# Patient Record
Sex: Male | Born: 1955 | Race: White | Hispanic: No | Marital: Married | Smoking: Never smoker
Health system: Southern US, Community
[De-identification: ages and names within clinical notes are randomized; demographics above are authoritative.]

## PROBLEM LIST (undated history)

## (undated) DIAGNOSIS — C801 Malignant (primary) neoplasm, unspecified: Secondary | ICD-10-CM

## (undated) DIAGNOSIS — D0372 Melanoma in situ of left lower limb, including hip: Secondary | ICD-10-CM

## (undated) DIAGNOSIS — N486 Induration penis plastica: Secondary | ICD-10-CM

## (undated) DIAGNOSIS — Z872 Personal history of diseases of the skin and subcutaneous tissue: Secondary | ICD-10-CM

## (undated) DIAGNOSIS — Z9889 Other specified postprocedural states: Secondary | ICD-10-CM

## (undated) HISTORY — DX: Malignant (primary) neoplasm, unspecified: C80.1

## (undated) HISTORY — DX: Personal history of diseases of the skin and subcutaneous tissue: Z87.2

## (undated) HISTORY — PX: PROSTATE BIOPSY: SHX241

## (undated) HISTORY — DX: Melanoma in situ of left lower limb, including hip: D03.72

## (undated) HISTORY — DX: Other specified postprocedural states: Z98.890

## (undated) HISTORY — DX: Induration penis plastica: N48.6

## (undated) HISTORY — PX: TONSILLECTOMY AND ADENOIDECTOMY: SUR1326

---

## 1988-06-27 DIAGNOSIS — Z872 Personal history of diseases of the skin and subcutaneous tissue: Secondary | ICD-10-CM

## 1988-06-27 HISTORY — DX: Personal history of diseases of the skin and subcutaneous tissue: Z87.2

## 1993-06-27 DIAGNOSIS — D0372 Melanoma in situ of left lower limb, including hip: Secondary | ICD-10-CM

## 1993-06-27 HISTORY — DX: Melanoma in situ of left lower limb, including hip: D03.72

## 2006-06-27 HISTORY — PX: OTHER SURGICAL HISTORY: SHX169

## 2007-10-06 ENCOUNTER — Observation Stay: Payer: Self-pay | Admitting: Internal Medicine

## 2007-10-06 ENCOUNTER — Other Ambulatory Visit: Payer: Self-pay

## 2009-01-14 IMAGING — CR DG CHEST 1V PORT
1 series · 1 of 1 positions shown · non-contrast
Comparison: none

REASON FOR EXAM: Chest Pain
COMMENTS:

PROCEDURE:     DXR - DXR PORTABLE CHEST SINGLE VIEW  - October 06, 2007  [DATE]
RESULT:     Comparison: No available comparison exam.

[view not recorded]
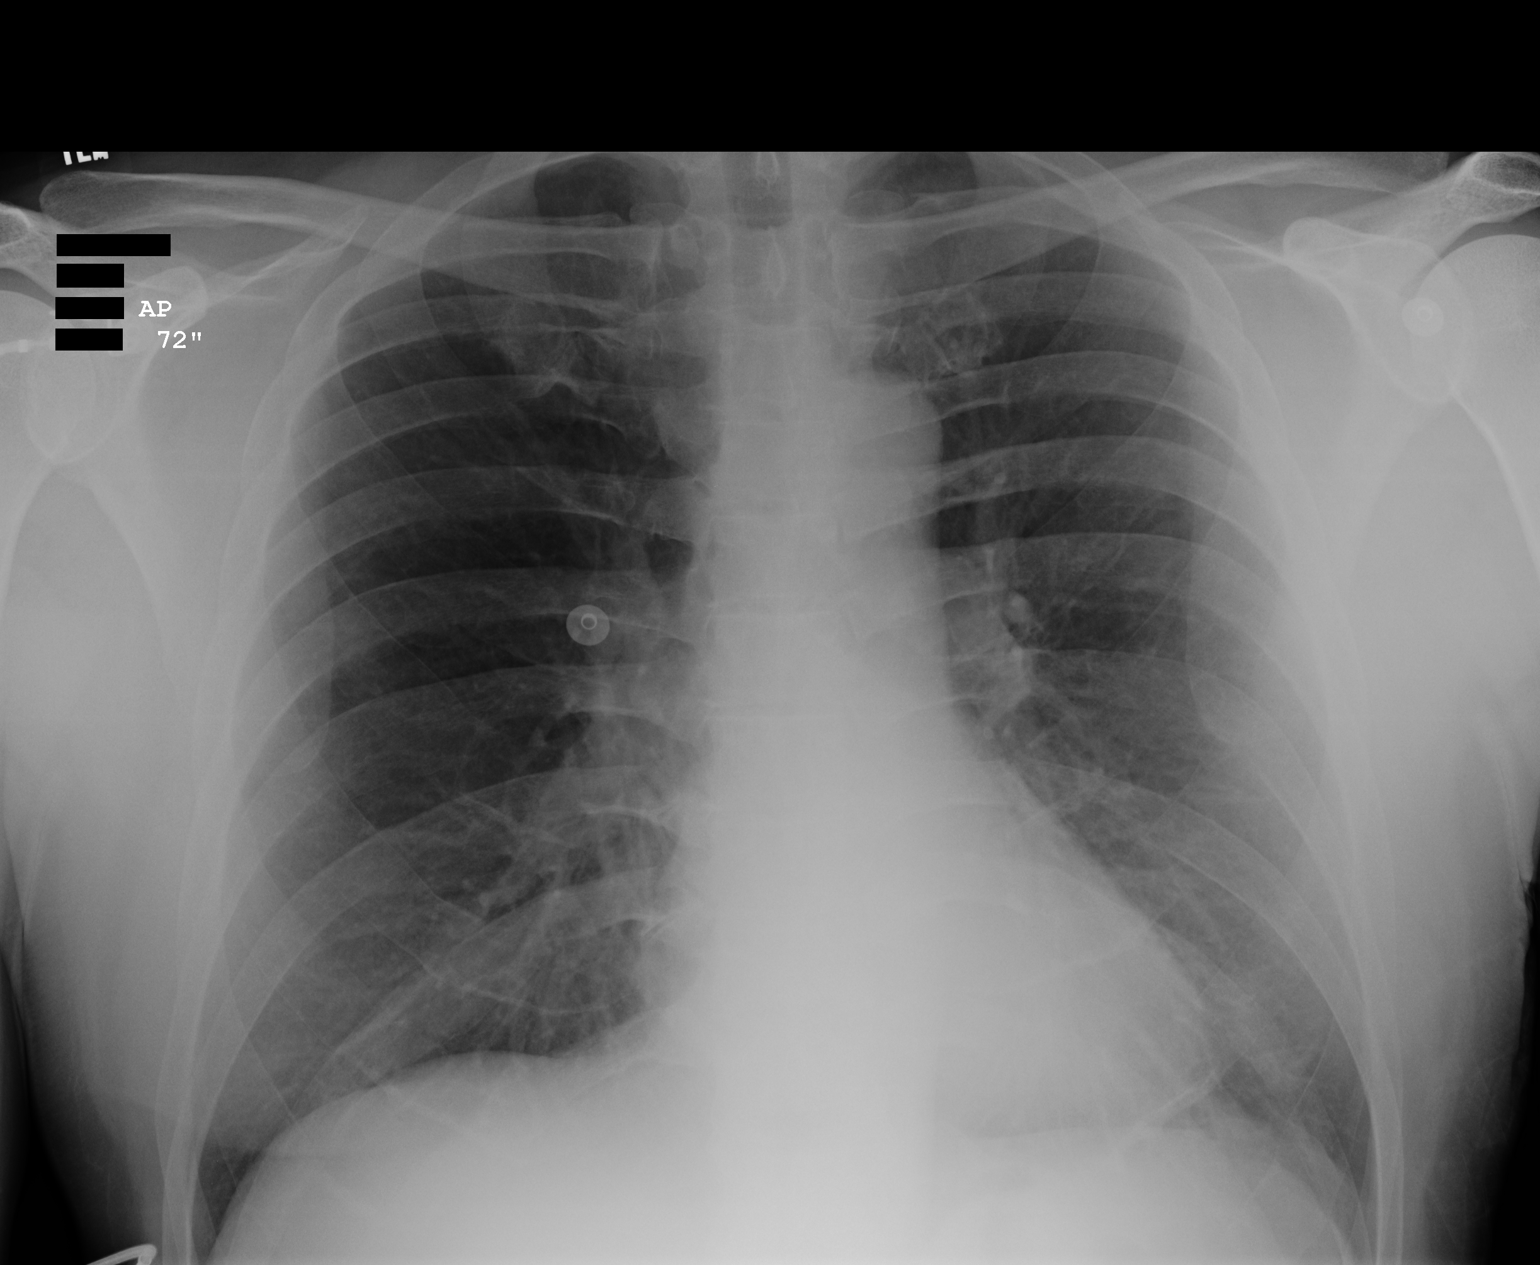

[1 of 1 positions shown; findings below may reference images not displayed]

FINDINGS: There is mild left basilar atelectasis. There is otherwise no significant
pulmonary consolidation, pulmonary edema, pleural effusion, nor
pneumothorax. The cardiomediastinal silhouette is within normal limits.

No grossly displaced rib fracture is noted.
IMPRESSION: 1. No acute cardiopulmonary abnormality is noted.

## 2009-06-29 ENCOUNTER — Ambulatory Visit: Payer: Self-pay | Admitting: Internal Medicine

## 2009-09-14 ENCOUNTER — Ambulatory Visit: Payer: Self-pay | Admitting: Gastroenterology

## 2010-10-08 IMAGING — CR DG CHEST 2V
1 series · 2 of 2 positions shown · non-contrast
Comparison: none

REASON FOR EXAM: bronchitis
COMMENTS:

[Series 1: view not recorded · 0.17mm/px · 2 of 2 slices shown]
[im 1/2]
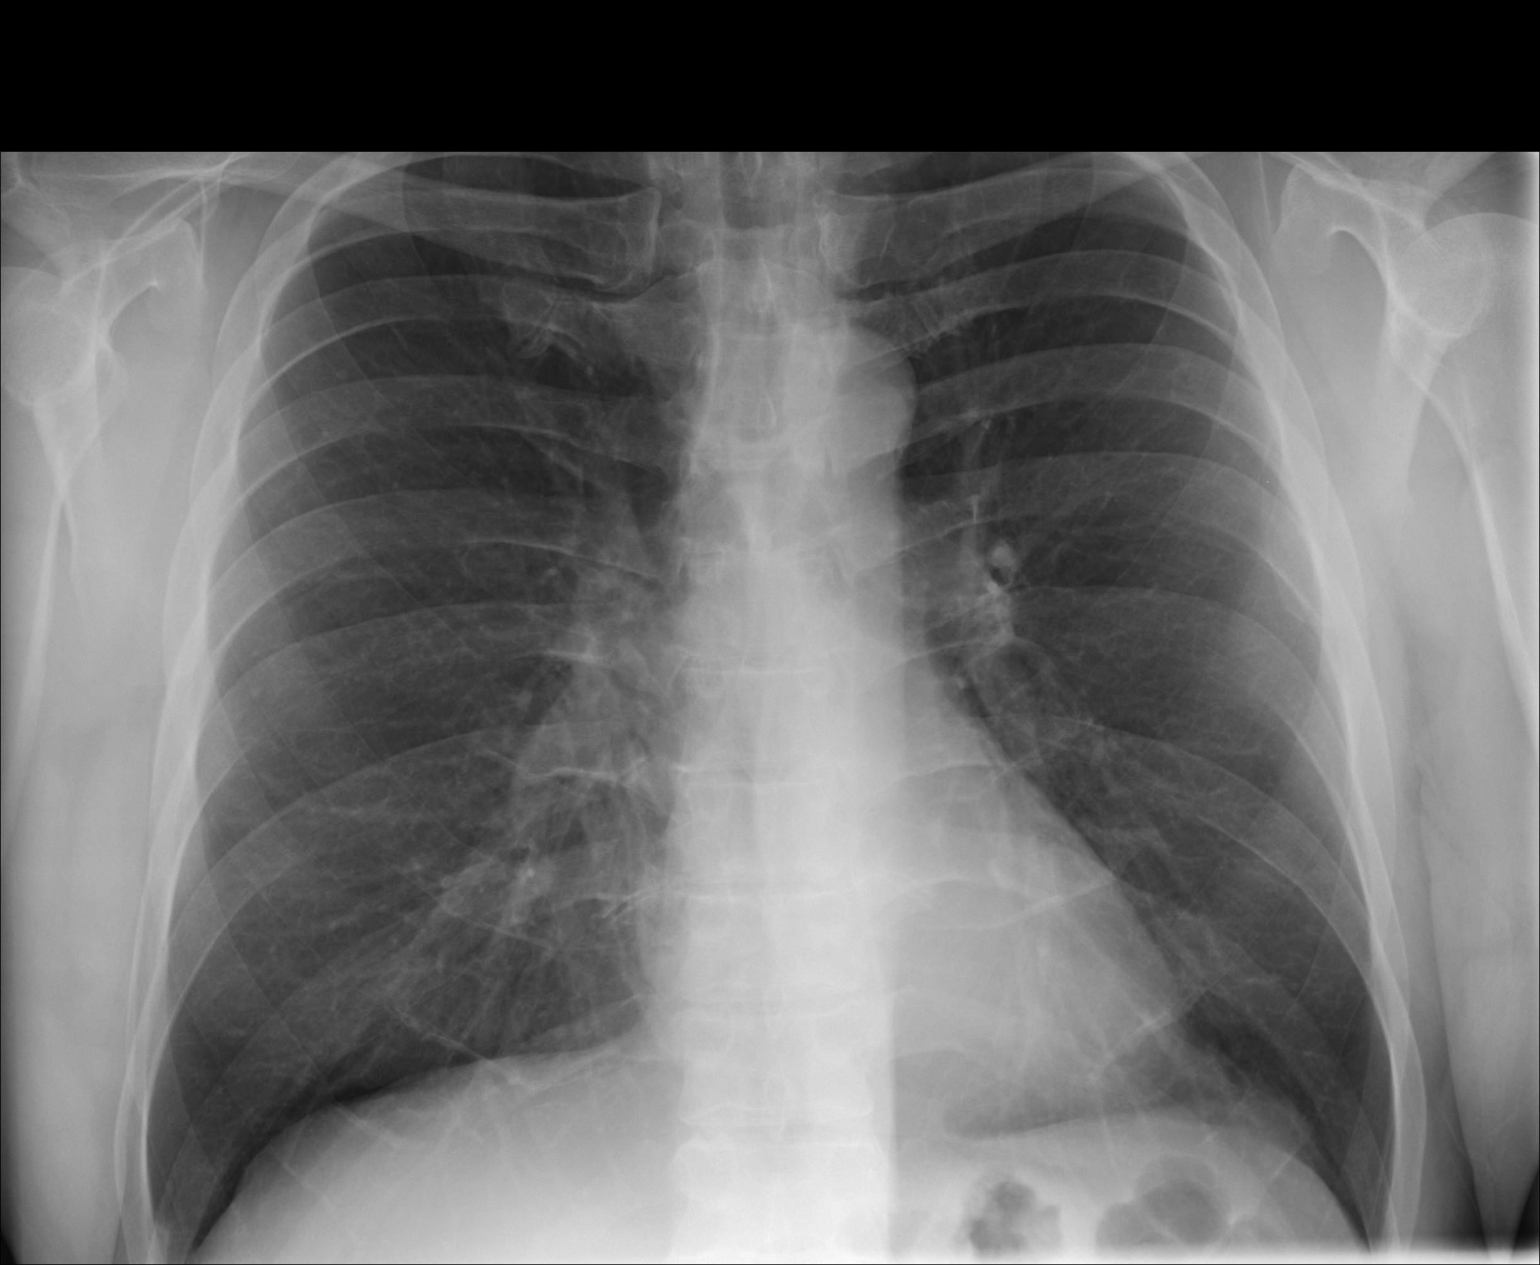
[im 2/2]
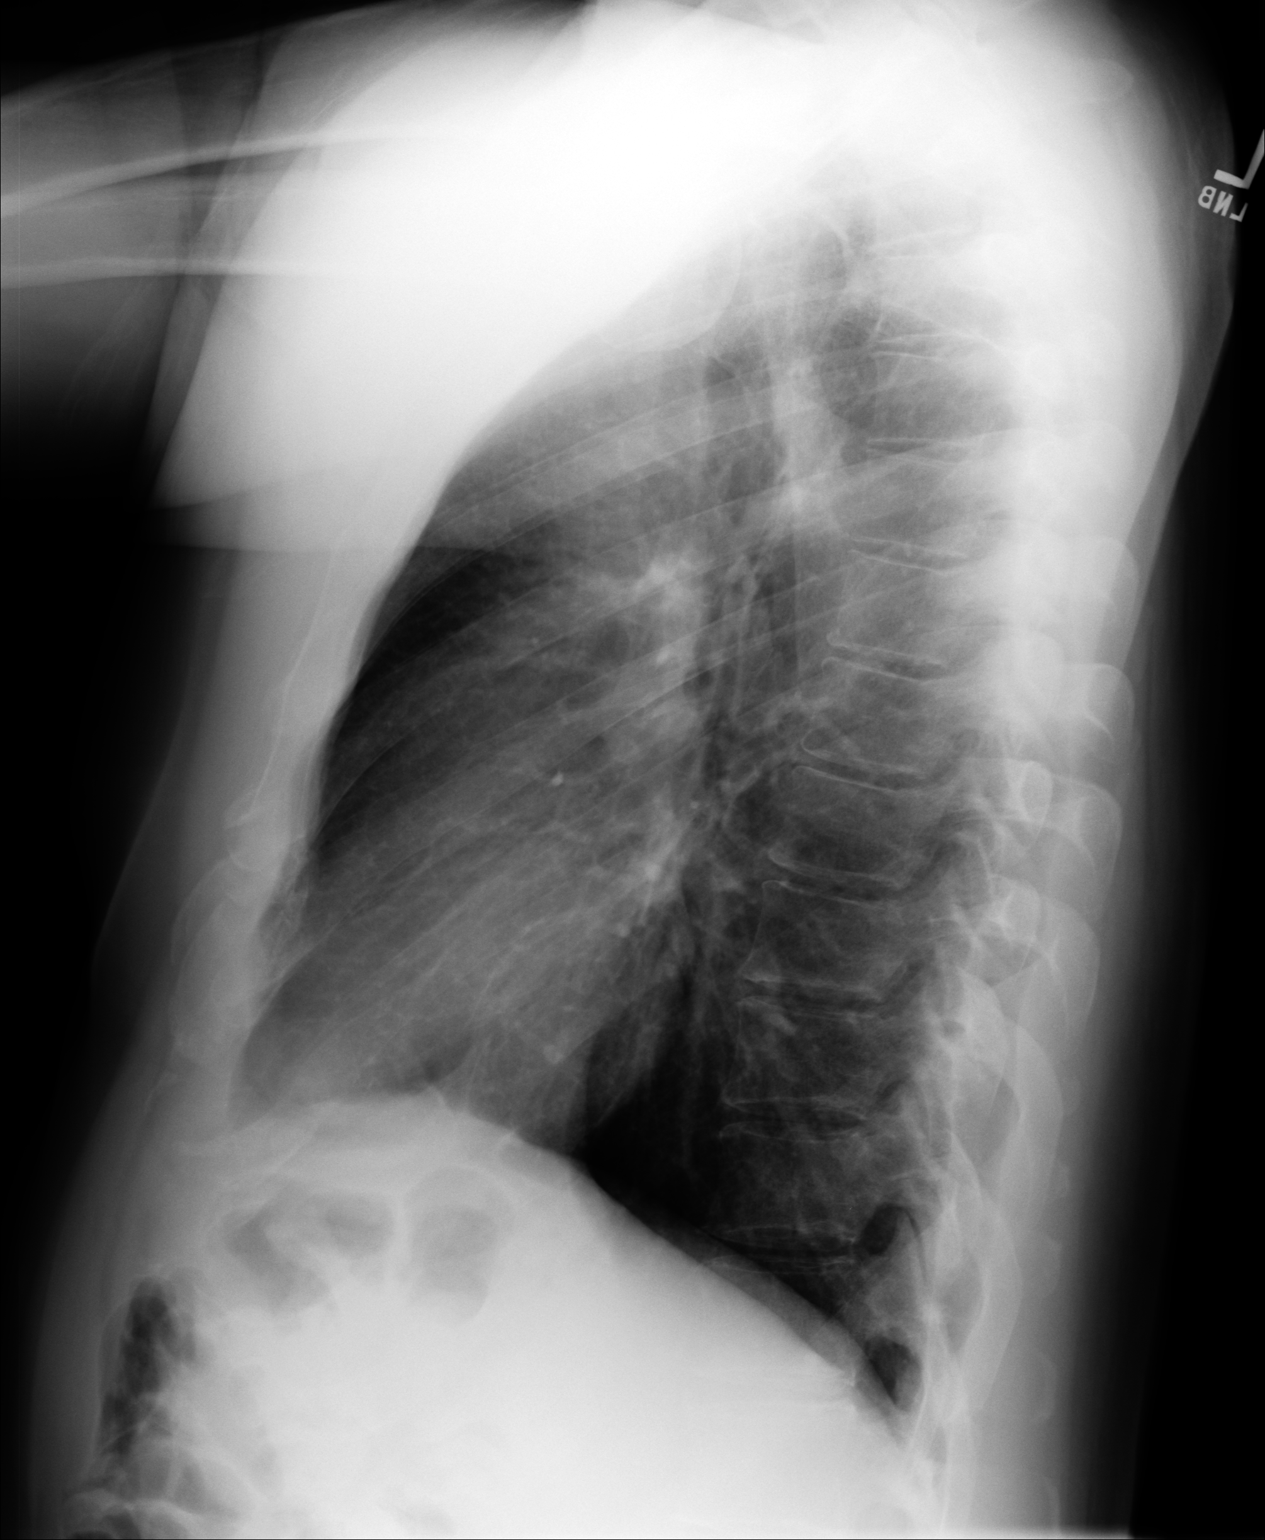

[2 of 2 positions shown; findings below may reference images not displayed]

PROCEDURE:     DXR - DXR CHEST PA (OR AP) AND LATERAL  - June 29, 2009  [DATE]

RESULT:     Comparison is made to the exam of 10/06/2007.

The lungs are clear. The heart and pulmonary vessels are normal. The bony
and mediastinal structures are unremarkable. There is no effusion. There is
no pneumothorax or evidence of congestive failure.
IMPRESSION: No acute cardiopulmonary disease. Stable appearance

## 2013-08-26 DIAGNOSIS — N400 Enlarged prostate without lower urinary tract symptoms: Secondary | ICD-10-CM | POA: Insufficient documentation

## 2015-12-17 ENCOUNTER — Ambulatory Visit: Payer: Self-pay | Admitting: Surgery

## 2015-12-30 ENCOUNTER — Encounter: Payer: Self-pay | Admitting: Surgery

## 2015-12-30 ENCOUNTER — Ambulatory Visit (INDEPENDENT_AMBULATORY_CARE_PROVIDER_SITE_OTHER): Payer: 59 | Admitting: Surgery

## 2015-12-30 ENCOUNTER — Other Ambulatory Visit: Payer: Self-pay

## 2015-12-30 VITALS — BP 113/71 | HR 55 | Temp 98.7°F | Ht 74.0 in | Wt 210.0 lb

## 2015-12-30 DIAGNOSIS — L720 Epidermal cyst: Secondary | ICD-10-CM

## 2015-12-30 NOTE — Patient Instructions (Addendum)
Please see your appointment listed below for removing the Lipoma on your back. Please call our office if you have any questions or concerns.

## 2015-12-30 NOTE — Progress Notes (Signed)
Patient ID: Paul Pena, male   DOB: [birth year], 60 y.o.   MRN: [id number]  History of Present Illness Paul Pena is a 60 y.o. male seen in consultation for 2 lesions on his back. They have been present for a while and they have been removed in the past but now they came back. Has seen primary care physician and he is referred back to me for excision of these masses. He reports they're uncomfortable and they're growing in size. Some very mild pain but no evidence of infection or complication.  Past Medical History Past Medical History  Diagnosis Date  . Cancer ([place])   . Melanoma in situ of left lower leg ([place]) 1995  . H/O excision of epidermal inclusion cyst 1990     Past Surgical History  Procedure Laterality Date  . Tonsillectomy and adenoidectomy  child    No Known Allergies  Current Outpatient Prescriptions  Medication Sig Dispense Refill  . tamsulosin (FLOMAX) 0.4 MG CAPS capsule TAKE 1 CAPSULE (0.4 MG TOTAL) BY MOUTH ONCE DAILY. TAKE 30 MINUTES AFTER SAME MEAL EACH DAY.  11   No current facility-administered medications for this visit.    Family History Family History  Problem Relation Age of Onset  . Cancer Mother   . Heart disease Father   . Cancer Brother      Social History Social History  Substance Use Topics  . Smoking status: Never Smoker   . Smokeless tobacco: Never Used  . Alcohol Use: None     ROS 10 point ROS performed and is otherwise negative  Physical Exam Blood pressure 113/71, pulse 55, temperature 98.7 F (37.1 C), temperature source Oral, height 6\' 2"  (1.88 m), weight 95.255 kg (210 lb).  CONSTITUTIONAL: NAD EYES: Pupils equal, round, and reactive to light, Sclera non-icteric. GI: The abdomen is soft, nontender, and nondistended. There were no palpable masses. There was no hepatosplenomegaly. There were normal bowel sounds. SKIN:There are 2 epidermal inclusion cyst on the back one measuring 1.5 cm and the other measuring  approximately 2 cm. They are nontender no evidence of cellulitis or infection  NEUROLOGIC:  Motor and sensation is grossly normal.  Cranial nerves are grossly intact. PSYCH:  Alert and oriented to person, place and time. Affect is normal.   Assessment/ Plan  Symptomatic epidermal inclusion cyst that have recurred on his back. Discussed with the patient in detail about options of expectant management versus excision. He wants to go ahead and proceed with excision. Procedure discussed with the patient in detail, risks, benefits and possible complications including but not limited to: Bleeding, infection, recurrence, pain and prolonged wound healing. He understands and wishes to proceed. We will perform in the office under local.   Caroleen Hamman, MD [place] 12/30/2015, 2:48 PM

## 2016-02-01 ENCOUNTER — Ambulatory Visit (INDEPENDENT_AMBULATORY_CARE_PROVIDER_SITE_OTHER): Payer: 59 | Admitting: Surgery

## 2016-02-01 ENCOUNTER — Encounter: Payer: Self-pay | Admitting: Surgery

## 2016-02-01 VITALS — BP 132/80 | HR 52 | Temp 98.1°F | Ht 73.0 in | Wt 210.4 lb

## 2016-02-01 DIAGNOSIS — L720 Epidermal cyst: Secondary | ICD-10-CM | POA: Diagnosis not present

## 2016-02-01 MED ORDER — TRAMADOL-ACETAMINOPHEN 37.5-325 MG PO TABS: 1.0000 | ORAL_TABLET | Freq: Four times a day (QID) | ORAL | 0 refills | Status: DC | PRN

## 2016-02-01 NOTE — Progress Notes (Signed)
Patient ID: Paul Pena, male   DOB: 09/02/1955, 60 y.o.   MRN: [id number] HPI   BRIEF OP NOTE  PREOP DX: MULTIPLE EIC BACK  POST OP DX: SAME  PROCEDURES:1.  Excisions of multiple EIC encompassing an area of 3.5 cm 2. Complex wound closure of a 10 cm defect with  creation of cutaneous flaps and extensive undermining  ANESTHESIA: lidocaine 1% w epi 30cc  EBL: [id number]  COMPLICATIONS: NONE  FINDINGS: CHRONIC INFLAMMATORY RESPONSE W MULTIPLE EIC BACK  She was put in with the risk-benefit of procedure possible complications and a written consent was obtained. He was placed on a prone position and was prepped and draped in the usual sterile fashion. There were 3 epidermal inclusion cyst incorporating an area of widest diameter of 3.5 cm. An elliptical incision incorporating all the epidermal inclusion cyst was created. Using a 10 blade knife I was able to go down to the subcutaneous tissue and excised theses cysts as well as the subcutaneous tissue surrounding them with the portion of the skin. Hemostasis was obtained with hand-held cautery. I was able to create inferior and superior flaps and extensive undermining to be able to close the large defect. This was done with a combination of scissors and cautery. Once we have an adequate mobilization of the inferior and superior flaps and we closed the wound in multiple layers with interrupted Vicryl sutures as well as 4-0 Monocryl in a subcuticular fashion. Dermabond was applied to the skin. Needle and laparotomy counts were correct and there were no complications    Caroleen Hamman, MD FACS General Surgeon 02/01/2016, 10:41 AM

## 2016-02-01 NOTE — Patient Instructions (Addendum)
You may take a shower on Wednesday. You may wash with soap and water but avoid any ointments, creams, or lotions to this area.  You will not want to do any activities that will require you to sweat for the next 48 hours.  Avoid golfing or activities that will stress/pull this area of your back for the next 2 weeks. We will see you back in the office at that time and talk about whether restrictions need to be continued or not.  Your sutures will dissolve over the next several weeks and the glue will come off in 10-14 days.  Please call our office with any questions or concerns that you may have.

## 2016-02-02 ENCOUNTER — Encounter: Payer: Self-pay | Admitting: Surgery

## 2016-02-25 ENCOUNTER — Encounter: Payer: Self-pay | Admitting: Surgery

## 2016-02-25 ENCOUNTER — Ambulatory Visit (INDEPENDENT_AMBULATORY_CARE_PROVIDER_SITE_OTHER): Payer: 59 | Admitting: Surgery

## 2016-02-25 VITALS — BP 115/70 | HR 60 | Temp 97.9°F | Ht 73.0 in | Wt 211.0 lb

## 2016-02-25 DIAGNOSIS — Z09 Encounter for follow-up examination after completed treatment for conditions other than malignant neoplasm: Secondary | ICD-10-CM

## 2016-02-25 NOTE — Progress Notes (Signed)
S/p EIC excisions on the back Path d/w pt in detail No complaints and doing very well  PE incision c/d/i, no infection  A/p doing well after excision eic RTC prn

## 2016-02-25 NOTE — Patient Instructions (Signed)
Please call our office with any questions or concerns.  Use sun block to incision area over the next year if this area will be exposed to sun. This helps decrease scarring.  You may now resume your normal activities. Listen to your body when lifting, if you have pain when lifting, stop and then try again in a few days.  If you develop redness, drainage, or pain at incision sites- call our office immediately and speak with a nurse.

## 2016-12-12 DIAGNOSIS — E785 Hyperlipidemia, unspecified: Secondary | ICD-10-CM | POA: Insufficient documentation

## 2016-12-12 DIAGNOSIS — M545 Low back pain, unspecified: Secondary | ICD-10-CM | POA: Insufficient documentation

## 2016-12-12 DIAGNOSIS — L409 Psoriasis, unspecified: Secondary | ICD-10-CM | POA: Insufficient documentation

## 2017-01-26 DIAGNOSIS — R350 Frequency of micturition: Secondary | ICD-10-CM | POA: Insufficient documentation

## 2017-01-26 DIAGNOSIS — R339 Retention of urine, unspecified: Secondary | ICD-10-CM | POA: Insufficient documentation

## 2017-01-26 DIAGNOSIS — Z87448 Personal history of other diseases of urinary system: Secondary | ICD-10-CM | POA: Insufficient documentation

## 2017-07-06 DIAGNOSIS — N411 Chronic prostatitis: Secondary | ICD-10-CM | POA: Insufficient documentation

## 2017-12-18 DIAGNOSIS — B009 Herpesviral infection, unspecified: Secondary | ICD-10-CM | POA: Insufficient documentation

## 2018-02-19 ENCOUNTER — Ambulatory Visit: Payer: Managed Care, Other (non HMO) | Admitting: Urology

## 2018-02-19 ENCOUNTER — Encounter: Payer: Self-pay | Admitting: Urology

## 2018-02-19 VITALS — BP 121/80 | HR 62 | Ht 73.0 in | Wt 217.2 lb

## 2018-02-19 DIAGNOSIS — R350 Frequency of micturition: Secondary | ICD-10-CM

## 2018-02-19 DIAGNOSIS — R972 Elevated prostate specific antigen [PSA]: Secondary | ICD-10-CM | POA: Diagnosis not present

## 2018-02-19 DIAGNOSIS — N486 Induration penis plastica: Secondary | ICD-10-CM | POA: Diagnosis not present

## 2018-02-19 DIAGNOSIS — N401 Enlarged prostate with lower urinary tract symptoms: Secondary | ICD-10-CM

## 2018-02-19 NOTE — Progress Notes (Signed)
02/19/2018 4:06 PM   Paul Pena [name] 09/20/1955 [id number]  Referring provider: [facility] [address] [place], [place] [address]  Chief Complaint  Patient presents with  . Establish Care    HPI: 62 year old male presents to establish local urologic care.  He has previously seen Dr. Jacqlyn Larsen at [facility] and initially saw him in August 2018 for a PSA of 5.05.  He was given a trial of tamsulosin and a repeat PSA in October 2018 was 5.85.  He subsequently underwent biopsy of the prostate.  His prostate volume was 54 cc.  Standard 12 core biopsies were performed with pathology showing benign prostate tissue with foci of chronic inflammation.  He was started on finasteride in January 2019 based on prostate volume.  He had also continued finasteride.  A repeat PSA in April 2019 had an appropriate decrease to 2.55.  He and his wife state today since starting finasteride he has developed Peyronie's disease and they think it is related to this medication.  He was not having significant voiding symptoms and it is unclear why he was started on finasteride.  They would like to discontinue the medication.  He does have urinary frequency and states this is not really improved on tamsulosin.  He relates to a history of lifelong urinary frequency.  His penile curvature is dorsal and estimated at approximately 30 degrees.  He has no pain with erections.  The curvature has been stable since it started in January.  He denies erectile dysfunction or hourglass deformity.  The curvature does not prohibit intercourse.   PMH: Past Medical History:  Diagnosis Date  . Cancer ([place])   . H/O excision of epidermal inclusion cyst 1990  . Melanoma in situ of left lower leg ([place]) 1995  . Peyronie's disease     Surgical History: Past Surgical History:  Procedure Laterality Date  . MOSE Procedure for Skin Cancer on Face  2008  . PROSTATE BIOPSY    . TONSILLECTOMY AND ADENOIDECTOMY  child    Home  Medications:  Allergies as of 02/19/2018   No Known Allergies     Medication List        Accurate as of 02/19/18  4:06 PM. Always use your most recent med list.          finasteride 5 MG tablet Commonly known as:  PROSCAR Take 5 mg by mouth daily.   tamsulosin 0.4 MG Caps capsule Commonly known as:  FLOMAX TAKE 1 CAPSULE (0.4 MG TOTAL) BY MOUTH ONCE DAILY. TAKE 30 MINUTES AFTER SAME MEAL EACH DAY.   valACYclovir 1000 MG tablet Commonly known as:  VALTREX 1 TABLET TWICE DAILY FOR 3 DAYS AS NEEDED FOR FLARES       Allergies: No Known Allergies  Family History: Family History  Problem Relation Age of Onset  . Cancer Mother        Gastric or Uterine  . Heart disease Father   . Cancer Brother     Social History:  reports that he has never smoked. He has never used smokeless tobacco. He reports that he does not drink alcohol or use drugs.  ROS: UROLOGY Frequent Urination?: Yes Hard to postpone urination?: No Burning/pain with urination?: No Get up at night to urinate?: Yes Leakage of urine?: No Urine stream starts and stops?: No Trouble starting stream?: No Do you have to strain to urinate?: No Blood in urine?: No Urinary tract infection?: No Sexually transmitted disease?: No Injury to kidneys or  bladder?: No Painful intercourse?: No Weak stream?: No Erection problems?: Yes Penile pain?: No  Gastrointestinal Nausea?: No Vomiting?: No Indigestion/heartburn?: No Diarrhea?: No Constipation?: No  Constitutional Fever: No Night sweats?: No Weight loss?: No Fatigue?: No  Skin Skin rash/lesions?: No Itching?: No  Eyes Blurred vision?: No Double vision?: No  Ears/Nose/Throat Sore throat?: No Sinus problems?: No  Hematologic/Lymphatic Swollen glands?: No Easy bruising?: No  Cardiovascular Leg swelling?: No Chest pain?: No  Respiratory Cough?: No Shortness of breath?: No  Endocrine Excessive thirst?: No  Musculoskeletal Back pain?:  No Joint pain?: No  Neurological Headaches?: No Dizziness?: No  Psychologic Depression?: No Anxiety?: No  Physical Exam: BP 121/80 (BP Location: Left Arm, Patient Position: Sitting, Cuff Size: Large)   Pulse 62   Ht 6\' 1"  (1.854 m)   Wt 217 lb 3.2 oz (98.5 kg)   BMI 28.66 kg/m   Constitutional:  Alert and oriented, No acute distress. HEENT: [place] AT, moist mucus membranes.  Trachea midline, no masses. Cardiovascular: No clubbing, cyanosis, or edema. Respiratory: Normal respiratory effort, no increased work of breathing. GI: Abdomen is soft, nontender, nondistended, no abdominal masses GU: No CVA tenderness.  Prostate 45 g, smooth without nodules.  Mid-distal shaft dorsal plaque Lymph: No cervical or inguinal lymphadenopathy. Skin: No rashes, bruises or suspicious lesions. Neurologic: Grossly intact, no focal deficits, moving all 4 extremities. Psychiatric: Normal mood and affect.   Assessment & Plan:    1. Elevated PSA DRE is benign.  A PSA April 2019 was stable with an appropriate decrease on finasteride.  He would like to discontinue the finasteride.  We will hold off on a PSA today and have him follow-up in 3 months.  2.  Peyronie's disease The causative factor to finasteride is unknown.  It has been associated with post finasteride syndrome.  I discussed Xiaflex and he was provided literature.  He would like to hold off on any treatment at this time and will reassess in 3 months.  3.  BPH with lower urinary tract symptoms He will discontinue finasteride.  He would also like to stop tamsulosin.  He will restart if he notes worsening lower urinary tract symptoms.    Abbie Sons, [place] [address] [place], [place] [address] [phone]

## 2018-06-08 ENCOUNTER — Other Ambulatory Visit: Payer: Self-pay | Admitting: Family Medicine

## 2018-06-08 DIAGNOSIS — R972 Elevated prostate specific antigen [PSA]: Secondary | ICD-10-CM

## 2018-06-11 ENCOUNTER — Other Ambulatory Visit: Payer: Managed Care, Other (non HMO)

## 2018-06-11 DIAGNOSIS — R972 Elevated prostate specific antigen [PSA]: Secondary | ICD-10-CM

## 2018-06-12 ENCOUNTER — Telehealth: Payer: Self-pay | Admitting: Urology

## 2018-06-12 LAB — PSA: Prostate Specific Ag, Serum: 4.5 ng/mL — ABNORMAL HIGH (ref 0.0–4.0)

## 2018-06-12 NOTE — Telephone Encounter (Signed)
Called pt no answer. Left detailed message for pt informing him of the information below. Advised pt to call back with his thoughts on resuming finasteride.

## 2018-06-12 NOTE — Telephone Encounter (Signed)
Left vm to call back  Stoioff, Ronda Fairly, MD  [facility]        PSA was 4.5. This increase would reflect discontinuing of finasteride.

## 2019-08-17 ENCOUNTER — Ambulatory Visit: Payer: Self-pay

## 2019-08-18 ENCOUNTER — Ambulatory Visit: Payer: Medicare Other | Attending: Internal Medicine

## 2019-08-18 DIAGNOSIS — Z23 Encounter for immunization: Secondary | ICD-10-CM

## 2019-08-18 NOTE — Progress Notes (Signed)
   Covid-19 Vaccination Clinic  Name:  Paul Pena    MRN: [id number] DOB: 1956/06/07  08/18/2019  [name] was observed post Covid-19 immunization for 15 minutes without incidence. He was provided with Vaccine Information Sheet and instruction to access the V-Safe system.   [name] was instructed to call 911 with any severe reactions post vaccine: Marland Kitchen Difficulty breathing  . Swelling of your face and throat  . A fast heartbeat  . A bad rash all over your body  . Dizziness and weakness    Immunizations Administered    Name Date Dose VIS Date Route   Pfizer COVID-19 Vaccine 08/18/2019  3:09 PM 0.3 mL 06/07/2019 Intramuscular   Manufacturer: [place]   Lot: [id number]   [place]: [id number]

## 2019-09-10 ENCOUNTER — Ambulatory Visit: Payer: Medicare Other | Attending: Internal Medicine

## 2019-09-10 DIAGNOSIS — Z23 Encounter for immunization: Secondary | ICD-10-CM

## 2019-09-10 NOTE — Progress Notes (Signed)
   Covid-19 Vaccination Clinic  Name:  Paul Pena    MRN: [id number] DOB: 01-15-1956  09/10/2019  [name] was observed post Covid-19 immunization for 15 minutes without incident. He was provided with Vaccine Information Sheet and instruction to access the V-Safe system.   [name] was instructed to call 911 with any severe reactions post vaccine: Marland Kitchen Difficulty breathing  . Swelling of face and throat  . A fast heartbeat  . A bad rash all over body  . Dizziness and weakness   Immunizations Administered    Name Date Dose VIS Date Route   Pfizer COVID-19 Vaccine 09/10/2019  4:45 PM 0.3 mL 06/07/2019 Intramuscular   Manufacturer: [place]   Lot: [id number]   [place]: [id number]
